# Patient Record
Sex: Female | Born: 2000 | Race: White | Hispanic: No | Marital: Single | Smoking: Never smoker
Health system: Southern US, Community
[De-identification: ages and names within clinical notes are randomized; demographics above are authoritative.]

## PROBLEM LIST (undated history)

## (undated) ENCOUNTER — Ambulatory Visit: Discharge status: Absent without leave | Payer: Managed Care, Other (non HMO)

## (undated) HISTORY — PX: WISDOM TOOTH EXTRACTION: SHX21

## (undated) HISTORY — PX: APPENDECTOMY: SHX54

---
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
## 2019-03-01 ENCOUNTER — Other Ambulatory Visit: Payer: Self-pay

## 2019-03-01 ENCOUNTER — Encounter: Payer: Self-pay | Admitting: Emergency Medicine

## 2019-03-01 ENCOUNTER — Ambulatory Visit
Admission: EM | Admit: 2019-03-01 | Discharge: 2019-03-01 | Disposition: A | Discharge status: Home / Self Care | Payer: Managed Care, Other (non HMO) | Attending: Family Medicine | Admitting: Family Medicine

## 2019-03-01 DIAGNOSIS — L509 Urticaria, unspecified: Secondary | ICD-10-CM

## 2019-03-01 MED ORDER — HYDROXYZINE HCL 25 MG PO TABS: 25.0000 mg | ORAL_TABLET | Freq: Three times a day (TID) | ORAL | 0 refills | Status: DC | PRN

## 2019-03-01 MED ORDER — PREDNISONE 10 MG PO TABS: ORAL_TABLET | ORAL | 0 refills | Status: DC

## 2019-03-01 MED ORDER — METHYLPREDNISOLONE SODIUM SUCC 40 MG IJ SOLR
80.0000 mg | Freq: Once | INTRAMUSCULAR | Status: AC
  Administered 2019-03-01: 80 mg via INTRAMUSCULAR

## 2019-03-01 NOTE — ED Triage Notes (Signed)
Pt c/o hives on her arms, abdomen and her legs. Started this morning. She took benadryl and pepcid this morning and they went away then about 3:00 today they came back and she took benadryl and zantac. She states that the only thing she had changed is taking fluconazole for a yeast infection. She took it 2 days ago and yesterday.

## 2019-03-01 NOTE — ED Provider Notes (Signed)
[facility]    CSN: [id number] Arrival date & time: 03/01/19  1634  History   Chief Complaint Chief Complaint  Patient presents with  . Urticaria   HPI  18 year old female presents with hives.  Started this morning.  Patient reports that she took Benadryl and some Pepcid and that the hives seem to resolve.  She states that they recurred again around 3 PM.  She has taken additional Benadryl and Pepcid without relief.  She reports the rash is located on her trunk, arms, back of the neck, buttocks.  She states that it is incredibly itchy.  She states that she has recently taken some Diflucan and thinks that this may be a contributing factor.  She also drank a protein shake today which is not normal for her.  Last dose of Diflucan was yesterday.  No other new exposures.  No new contacts.  No reports of shortness of breath or tongue swelling.  No other associated symptoms.  No other complaints.  PMH, Surgical Hx, Family Hx, Social History reviewed and updated as below.  PMH: HLD, Hx of Pars fracture  Past Surgical History:  Procedure Laterality Date  . APPENDECTOMY    . WISDOM TOOTH EXTRACTION     OB History   No obstetric history on file.    Home Medications    Prior to Admission medications   Medication Sig Start Date End Date Taking? Authorizing Provider  AUROVELA FE 1/20 1-20 MG-MCG tablet TK 1 T PO QD 10/06/18  Yes [provider]  EPINEPHrine (EPIPEN 2-PAK) 0.3 mg/0.3 mL IJ SOAJ injection Inject 0.3 mLs (0.3 mg total) into the muscle once as needed for up to 1 dose. 02/12/18  Yes Dionne Bucy, MD  simvastatin (ZOCOR) 10 MG tablet Take by mouth. 12/21/18  Yes [provider]  hydrOXYzine (ATARAX/VISTARIL) 25 MG tablet Take 1 tablet (25 mg total) by mouth every 8 (eight) hours as needed for itching. 03/01/19   Tommie Sams, DO  predniSONE (DELTASONE) 10 MG tablet 50 mg daily x 2 days, then 40 mg daily x 2 days, then 30 mg daily x 2 days, then  20 mg daily x 2 days, then 10 mg daily x 2 days. 03/01/19   Tommie Sams, DO    Family History Hyperlipidemia (Elevated cholesterol) Father    Thyroid disease Maternal Aunt  had thyroidectomy, now on levothyroxine  Coronary Artery Disease (Blocked arteries around heart) Maternal Grandfather    Diabetes type II Maternal Grandfather    High blood pressure (Hypertension) Maternal Grandmother    Thyroid disease Other M great aunt thyroidectomy and on levothyroxine  Coronary Artery Disease (Blocked arteries around heart) Paternal Grandfather    Diabetes type II Paternal Grandfather    Hyperlipidemia (Elevated cholesterol) Paternal Grandfather    Stroke Paternal Grandfather    Breast cancer Paternal Grandmother    Hyperlipidemia (Elevated cholesterol) Paternal Grandmother    Myocardial Infarction (Heart attack) Paternal Uncle  deceased  Inflammatory bowel disease Neg Hx    Pancreatitis Neg Hx      Social History Social History   Tobacco Use  . Smoking status: Never Smoker  . Smokeless tobacco: Never Used  Substance Use Topics  . Alcohol use: Never  . Drug use: Not Currently     Allergies   Levofloxacin   Review of Systems Review of Systems  Constitutional: Negative.   Respiratory: Negative.   Skin: Positive for rash.   Physical Exam Triage Vital Signs ED Triage Vitals  Enc Vitals Group     BP 03/01/19 1647 124/71     Pulse Rate 03/01/19 1647 84     Resp 03/01/19 1647 18     Temp 03/01/19 1647 98.2 F (36.8 C)     Temp Source 03/01/19 1647 Oral     SpO2 03/01/19 1647 100 %     Weight 03/01/19 1643 118 lb (53.5 kg)     Height 03/01/19 1643 5\' 1"  (1.549 m)     Head Circumference --      Peak Flow --      Pain Score 03/01/19 1643 0     Pain Loc --      Pain Edu? --      Excl. in GC? --    Updated Vital Signs BP 124/71 (BP Location: Right Arm)   Pulse 84   Temp 98.2 F (36.8 C) (Oral)   Resp 18   Ht 5\' 1"  (1.549 m)   Wt 53.5 kg    LMP 02/26/2019 (Approximate)   SpO2 100%   BMI 22.30 kg/m   Visual Acuity Right Eye Distance:   Left Eye Distance:   Bilateral Distance:    Right Eye Near:   Left Eye Near:    Bilateral Near:     Physical Exam Vitals and nursing note reviewed.  Constitutional:      General: She is not in acute distress.    Appearance: Normal appearance. She is not ill-appearing.  HENT:     Head: Normocephalic and atraumatic.  Eyes:     General:        Right eye: No discharge.        Left eye: No discharge.     Conjunctiva/sclera: Conjunctivae normal.  Cardiovascular:     Rate and Rhythm: Normal rate and regular rhythm.     Heart sounds: No murmur.  Pulmonary:     Effort: Pulmonary effort is normal.     Breath sounds: Normal breath sounds. No wheezing, rhonchi or rales.  Skin:    Comments: Diffuse urticaria noted.  Neurological:     Mental Status: She is alert.  Psychiatric:        Mood and Affect: Mood normal.        Behavior: Behavior normal.    UC Treatments / Results  Labs (all labs ordered are listed, but only abnormal results are displayed) Labs Reviewed - No data to display  EKG   Radiology No results found.  Procedures Procedures (including critical care time)  Medications Ordered in UC Medications  methylPREDNISolone sodium succinate (SOLU-MEDROL) 40 mg/mL injection 80 mg (80 mg Intramuscular Given 03/01/19 1708)    Initial Impression / Assessment and Plan / UC Course  I have reviewed the triage vital signs and the nursing notes.  Pertinent labs & imaging results that were available during my care of the patient were reviewed by me and considered in my medical decision making (see chart for details).    18 year old female presents with hives.  Solu-Medrol given today.  Starting on Zyrtec, Pepcid, Atarax.  Prednisone to start tomorrow.  Final Clinical Impressions(s) / UC Diagnoses   Final diagnoses:  Urticaria     Discharge Instructions     OTC  Zyrtec 10 mg daily for the next week.  Pepcid 20 mg twice daily x 1 week.  Atarax as needed for itching.  Prednisone to start tomorrow.  No shakes.  Take care  Dr. Adriana Simasook    ED Prescriptions  Medication Sig Dispense Auth. Provider   predniSONE (DELTASONE) 10 MG tablet 50 mg daily x 2 days, then 40 mg daily x 2 days, then 30 mg daily x 2 days, then 20 mg daily x 2 days, then 10 mg daily x 2 days. 30 tablet Cherrelle Plante G, DO   hydrOXYzine (ATARAX/VISTARIL) 25 MG tablet Take 1 tablet (25 mg total) by mouth every 8 (eight) hours as needed for itching. 30 tablet Coral Spikes, DO     PDMP not reviewed this encounter.   Coral Spikes, [place] 03/01/19 1801

## 2019-03-01 NOTE — Discharge Instructions (Addendum)
OTC Zyrtec 10 mg daily for the next week.  Pepcid 20 mg twice daily x 1 week.  Atarax as needed for itching.  Prednisone to start tomorrow.  No shakes.  Take care  Dr. Lacinda Axon

## 2019-12-28 ENCOUNTER — Other Ambulatory Visit: Payer: Self-pay

## 2019-12-28 ENCOUNTER — Ambulatory Visit
Admission: EM | Admit: 2019-12-28 | Discharge: 2019-12-28 | Disposition: A | Discharge status: Home / Self Care | Payer: 59 | Attending: Internal Medicine | Admitting: Internal Medicine

## 2019-12-28 DIAGNOSIS — J029 Acute pharyngitis, unspecified: Secondary | ICD-10-CM | POA: Diagnosis not present

## 2019-12-28 DIAGNOSIS — Z20822 Contact with and (suspected) exposure to covid-19: Secondary | ICD-10-CM | POA: Insufficient documentation

## 2019-12-28 DIAGNOSIS — J0101 Acute recurrent maxillary sinusitis: Secondary | ICD-10-CM | POA: Diagnosis not present

## 2019-12-28 LAB — GROUP A STREP BY PCR: Group A Strep by PCR: NOT DETECTED

## 2019-12-28 LAB — SARS CORONAVIRUS 2 (TAT 6-24 HRS): SARS Coronavirus 2: NEGATIVE

## 2019-12-28 MED ORDER — FLUCONAZOLE 150 MG PO TABS: 150.0000 mg | ORAL_TABLET | Freq: Every day | ORAL | 0 refills | Status: AC

## 2019-12-28 MED ORDER — AMOXICILLIN-POT CLAVULANATE 875-125 MG PO TABS: 1.0000 | ORAL_TABLET | Freq: Two times a day (BID) | ORAL | 0 refills | Status: AC

## 2019-12-28 NOTE — ED Triage Notes (Signed)
Patient complains of headache, sore throat and facial pain and pressure x yesterday.

## 2019-12-28 NOTE — ED Provider Notes (Signed)
[facility]    CSN: [id number] Arrival date & time: 12/28/19  0820      History   Chief Complaint Chief Complaint  Patient presents with  . Sore Throat    HPI Caroline Skinner is a 19 y.o. female who present complaining of HA, ST and face pain and pressure since yesterday. Has been having allergy symptoms for the past month and takes Claritin daily since September. Had body aches last night, her HA is present on her forehead. Has discolored post nasal drainage. Is able to eat and swallow with some pain, but is able to.   History reviewed. No pertinent past medical history.  There are no problems to display for this patient.   Past Surgical History:  Procedure Laterality Date  . APPENDECTOMY    . WISDOM TOOTH EXTRACTION      OB History   No obstetric history on file.      Home Medications    Prior to Admission medications   Medication Sig Start Date End Date Taking? Authorizing Provider  AUROVELA FE 1/20 1-20 MG-MCG tablet TK 1 T PO QD 10/06/18  Yes [provider]  EPINEPHrine (EPIPEN 2-PAK) 0.3 mg/0.3 mL IJ SOAJ injection Inject 0.3 mLs (0.3 mg total) into the muscle once as needed for up to 1 dose. 02/12/18  Yes Dionne Bucy, MD  simvastatin (ZOCOR) 10 MG tablet Take by mouth. 12/21/18  Yes [provider]  amoxicillin-clavulanate (AUGMENTIN) 875-125 MG tablet Take 1 tablet by mouth every 12 (twelve) hours. 12/28/19   Rodriguez-Southworth, Nettie Elm, PA-C  fluconazole (DIFLUCAN) 150 MG tablet Take 1 tablet (150 mg total) by mouth daily. 12/28/19   Rodriguez-Southworth, Nettie Elm, PA-C    Family History Family History  Problem Relation Age of Onset  . Healthy Mother   . Healthy Father     Social History Social History   Tobacco Use  . Smoking status: Never Smoker  . Smokeless tobacco: Never Used  Vaping Use  . Vaping Use: Never used  Substance Use Topics  . Alcohol use: Never  . Drug use: Not Currently     Allergies     Levofloxacin   Review of Systems Review of Systems  Constitutional: Positive for chills and fever. Negative for appetite change, diaphoresis and fatigue.  HENT: Positive for congestion, postnasal drip, sinus pressure and sore throat. Negative for ear discharge, ear pain and trouble swallowing.   Eyes: Negative for discharge.  Respiratory: Negative for cough and shortness of breath.   Gastrointestinal: Negative for diarrhea, nausea and vomiting.  Musculoskeletal: Negative for gait problem and myalgias.  Skin: Negative for rash.  Allergic/Immunologic: Positive for environmental allergies.  Neurological: Positive for headaches. Negative for dizziness.  Hematological: Negative for adenopathy.     Physical Exam Triage Vital Signs ED Triage Vitals  Enc Vitals Group     BP 12/28/19 0846 129/74     Pulse Rate 12/28/19 0846 (!) 115     Resp 12/28/19 0846 18     Temp 12/28/19 0846 99.2 F (37.3 C)     Temp Source 12/28/19 0846 Oral     SpO2 12/28/19 0846 100 %     Weight 12/28/19 0844 120 lb (54.4 kg)     Height 12/28/19 0844 5\' 1"  (1.549 m)     Head Circumference --      Peak Flow --      Pain Score 12/28/19 0843 4     Pain Loc --      Pain  Edu? --      Excl. in GC? --    No data found.  Updated Vital Signs BP 129/74 (BP Location: Left Arm)   Pulse (!) 115   Temp 99.2 F (37.3 C) (Oral)   Resp 18   Ht 5\' 1"  (1.549 m)   Wt 120 lb (54.4 kg)   LMP 12/27/2019   SpO2 100%   BMI 22.67 kg/m   Visual Acuity Right Eye Distance:   Left Eye Distance:   Bilateral Distance:    Right Eye Near:   Left Eye Near:    Bilateral Near:     Physical Exam Psychiatric:        Behavior: Behavior normal.    Alert pt NAD who seems nasally congested EYES- non icterus, mild watering, no purulent drainage NOSE- moderate mucosa congestion noted. Has moderate bilateral maxillary tederness L>R, and mild ethmoid tenderness.  TM- both gray and little dull, canals are normal PHARYNX-  clear, clear drainage noted.  NECK- supple with no nodes LUNGS- clear HEART - RRR with no murmurs SKIN- non jaundiced, no rashes.   UC Treatments / Results  Labs (all labs ordered are listed, but only abnormal results are displayed) Labs Reviewed  GROUP A STREP BY PCR  SARS CORONAVIRUS 2 (TAT 6-24 HRS)    EKG   Radiology No results found.  Procedures Procedures (including critical care time)  Medications Ordered in UC Medications - No data to display  Initial Impression / Assessment and Plan / UC Course  I have reviewed the triage vital signs and the nursing notes. Has maxillary sinusitis. Placed on Augmentin as noted. See instructions.   Final Clinical Impressions(s) / UC Diagnoses   Final diagnoses:  Pharyngitis, unspecified etiology  Acute recurrent maxillary sinusitis     Discharge Instructions     Stop the Claritin for 5 days and use Flonase 2 sprays each nostril one time a day for 7 days Do saline nose rinses twice a day Do not use tap water, only boiled water that has been cooled down.  Do it twice a day  for 5-7 days, but avoid bed time.       ED Prescriptions    Medication Sig Dispense Auth. Provider   amoxicillin-clavulanate (AUGMENTIN) 875-125 MG tablet Take 1 tablet by mouth every 12 (twelve) hours. 14 tablet Rodriguez-Southworth, Loyalty Arentz, PA-C   fluconazole (DIFLUCAN) 150 MG tablet Take 1 tablet (150 mg total) by mouth daily. 1 tablet Rodriguez-Southworth, 12/29/2019, PA-C     PDMP not reviewed this encounter.   Nettie Elm, Garey Ham 12/28/19 12/30/19

## 2019-12-28 NOTE — Discharge Instructions (Signed)
Stop the Claritin for 5 days and use Flonase 2 sprays each nostril one time a day for 7 days Do saline nose rinses twice a day Do not use tap water, only boiled water that has been cooled down.  Do it twice a day  for 5-7 days, but avoid bed time.

## 2019-12-29 ENCOUNTER — Other Ambulatory Visit
Admission: RE | Admit: 2019-12-29 | Discharge: 2019-12-29 | Disposition: A | Discharge status: Home / Self Care | Payer: 59 | Source: Ambulatory Visit | Attending: Family Medicine | Admitting: Family Medicine

## 2019-12-29 ENCOUNTER — Emergency Department: Admission: EM | Admit: 2019-12-29 | Discharge: 2019-12-29 | Discharge status: Against Medical Advice | Payer: 59

## 2019-12-29 DIAGNOSIS — R002 Palpitations: Secondary | ICD-10-CM | POA: Insufficient documentation

## 2019-12-29 DIAGNOSIS — R0789 Other chest pain: Secondary | ICD-10-CM | POA: Insufficient documentation

## 2019-12-29 DIAGNOSIS — R000 Tachycardia, unspecified: Secondary | ICD-10-CM | POA: Diagnosis present

## 2019-12-29 LAB — FIBRIN DERIVATIVES D-DIMER (ARMC ONLY): Fibrin derivatives D-dimer (ARMC): 889.57 ng/mL (FEU) — ABNORMAL HIGH (ref 0.00–499.00)

## 2019-12-29 NOTE — ED Notes (Signed)
Patient up to stat desk to report she was leaving. Patient cautioned against dangers of leaving without further assessment. Patient verbalized understanding of information discussed and reported she was going to go to another facility. Patient again encouraged to stay. Patient left.

## 2020-04-26 IMAGING — CT CT ABD-PELV W/ CM
1 of 2 series · 15 of 32 positions shown, 19 images · IV contrast (omnipaque)
Comparison: Abdominal ultrasound dated March 16, 2012.

CLINICAL DATA: Right upper quadrant abdominal pain with nausea and
diarrhea.

EXAM:
CT ABDOMEN AND PELVIS WITH CONTRAST
TECHNIQUE: Multidetector CT imaging of the abdomen and pelvis was performed
using the standard protocol following bolus administration of
intravenous contrast.
CONTRAST:  75mL OMNIPAQUE IOHEXOL 300 MG/ML  SOLN

[Series 2: axial st · axial · 0.65mm/px · z∈[-462,-62]mm · 15 of 88 slices shown, 19 images]
[im 4/88  soft-tissue]
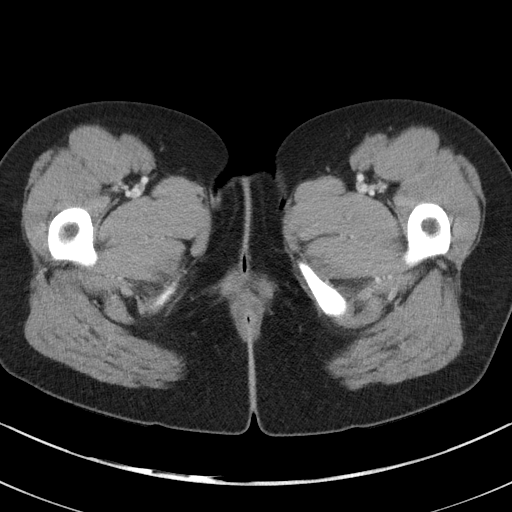
[im 4/88  bone]
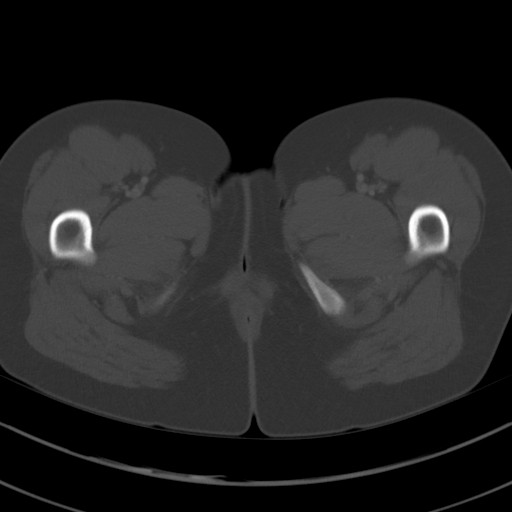
[im 11/88  soft-tissue]
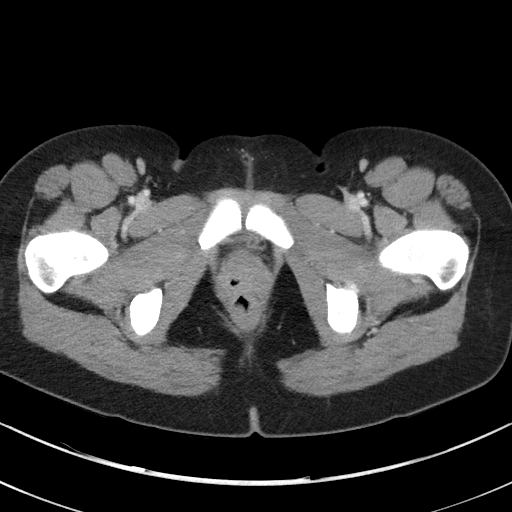
[im 17/88  soft-tissue]
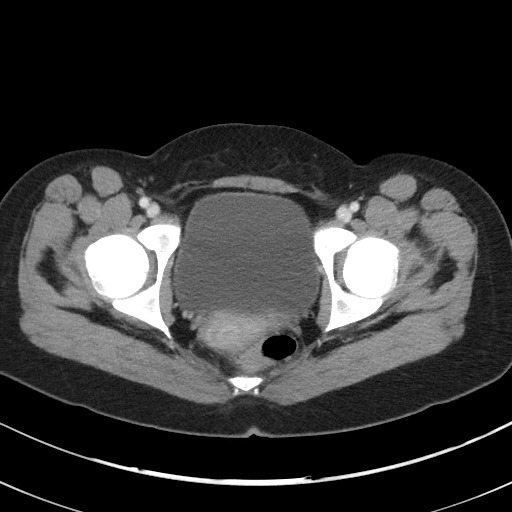
[im 24/88  soft-tissue]
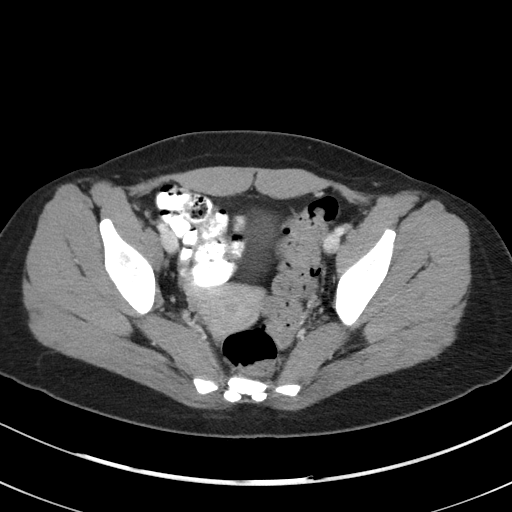
[im 31/88  soft-tissue]
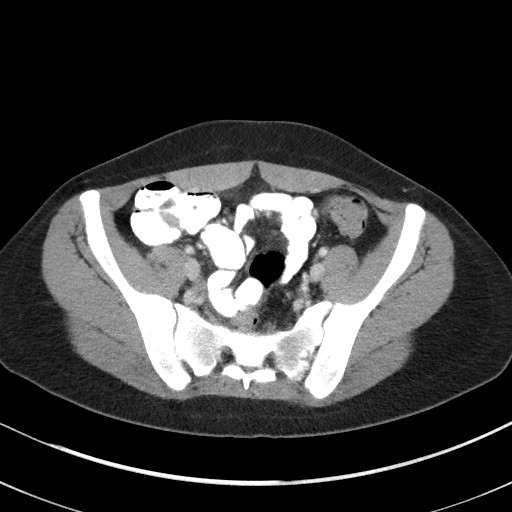
[im 37/88  soft-tissue]
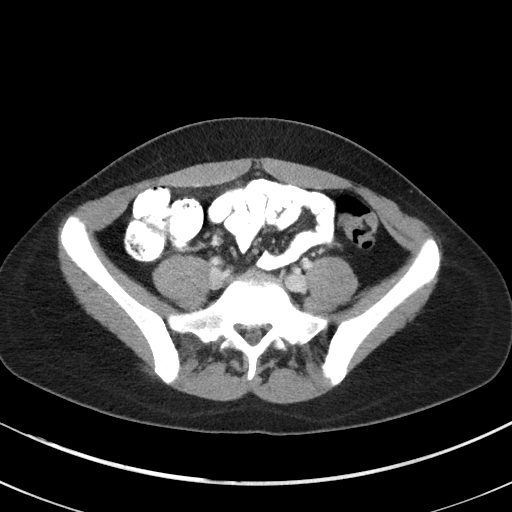
[im 44/88  soft-tissue]
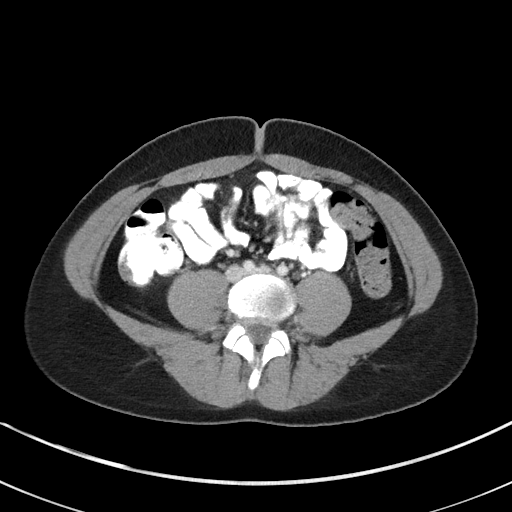
[im 51/88  soft-tissue]
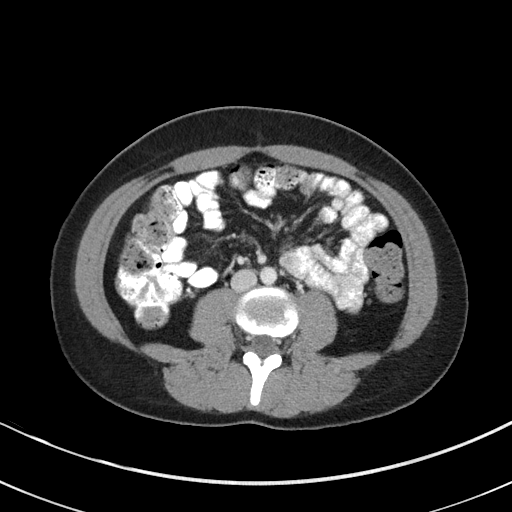
[im 57/88  soft-tissue]
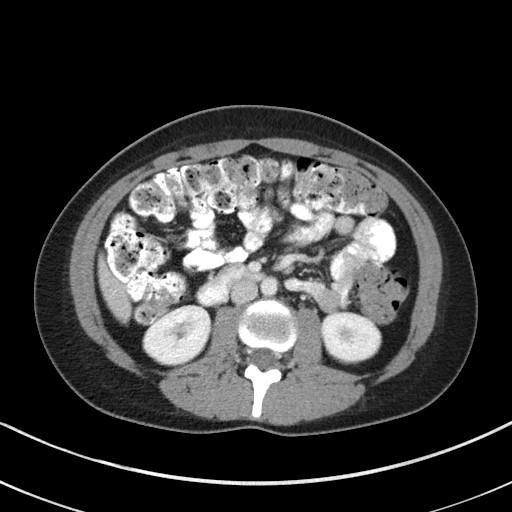
[im 57/88  bone]
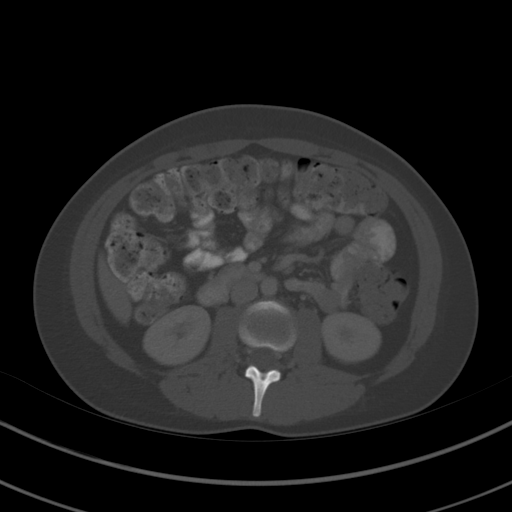
[im 64/88  soft-tissue]
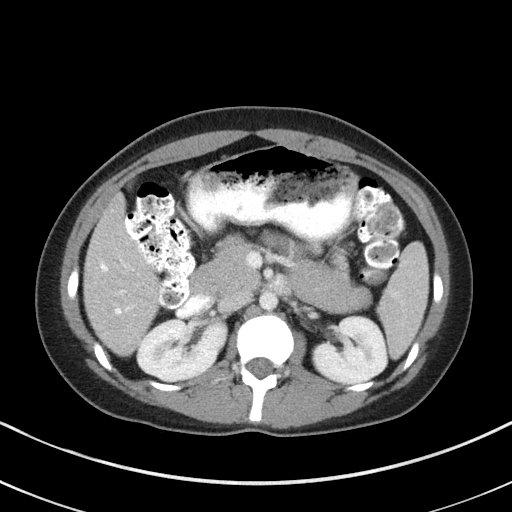
[im 71/88  soft-tissue]
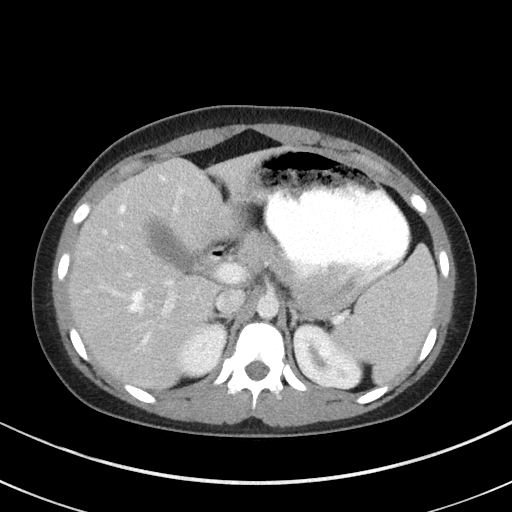
[im 74/88  lung]
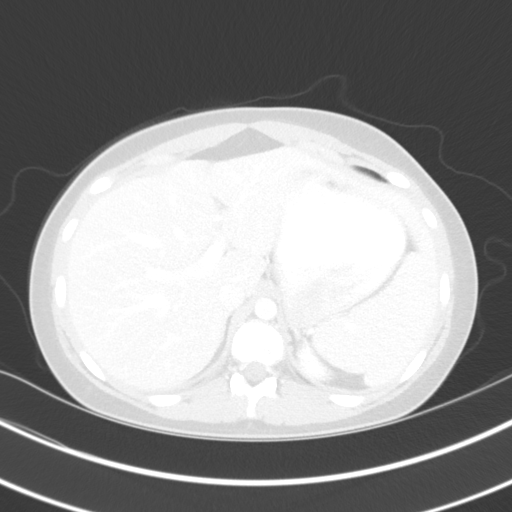
[im 77/88  soft-tissue]
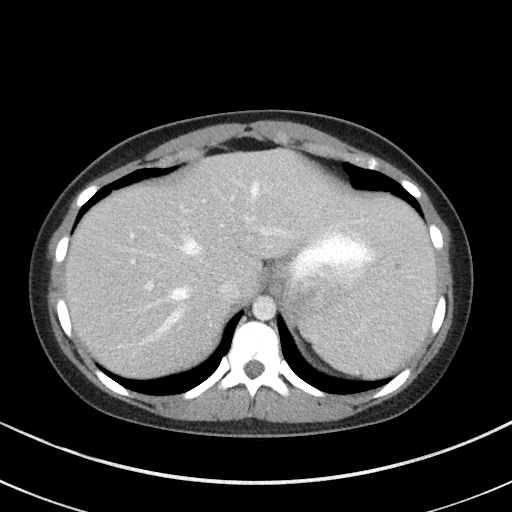
[im 77/88  lung]
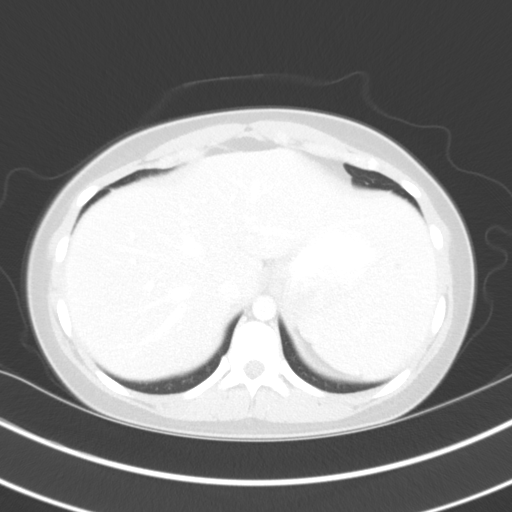
[im 81/88  lung]
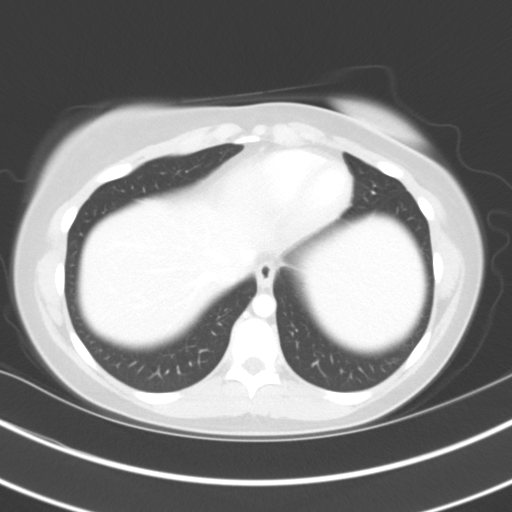
[im 84/88  soft-tissue]
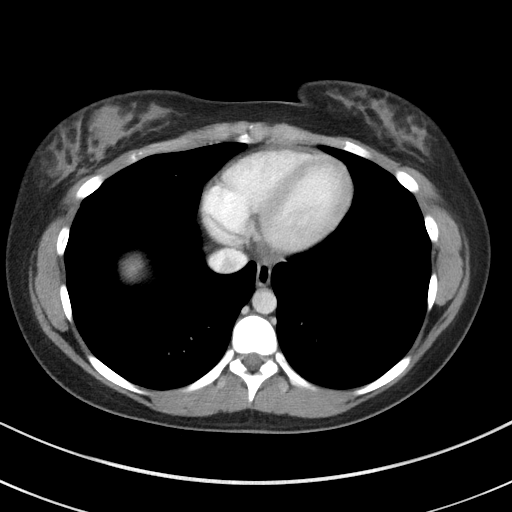
[im 84/88  lung]
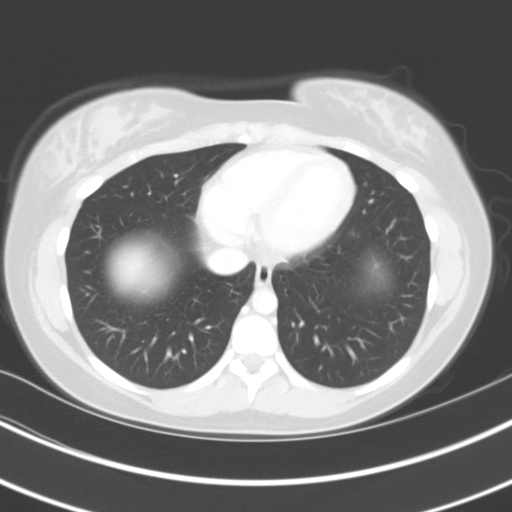

[15 of 32 positions shown; findings below may reference images not displayed]

FINDINGS: Lower chest: No acute abnormality.

Hepatobiliary: No focal liver abnormality is seen. No gallstones,
gallbladder wall thickening, or biliary dilatation.

Pancreas: Unremarkable. No pancreatic ductal dilatation or
surrounding inflammatory changes.

Spleen: Normal in size without focal abnormality.

Adrenals/Urinary Tract: Adrenal glands are unremarkable. Kidneys are
normal, without renal calculi, focal lesion, or hydronephrosis.
Bladder is unremarkable.

Stomach/Bowel: Stomach is within normal limits. The appendix is not
visualized in this patient with a history of prior appendectomy. No
evidence of bowel wall thickening, distention, or inflammatory
changes. Moderate stool throughout the colon.

Vascular/Lymphatic: No significant vascular findings are present. No
enlarged abdominal or pelvic lymph nodes.

Reproductive: Uterus and bilateral adnexa are unremarkable.

Other: No abdominal wall hernia or abnormality. No abdominopelvic
ascites. No pneumoperitoneum.

Musculoskeletal: No acute or significant osseous findings.
IMPRESSION: 1.  No acute intra-abdominal process.
2.  Prominent stool throughout the colon.
